# Patient Record
Sex: Female | Born: 1990 | Race: Black or African American | Hispanic: No | Marital: Single | Smoking: Never smoker
Health system: Southern US, Community
[De-identification: ages and names within clinical notes are randomized; demographics above are authoritative.]

---

## 2014-10-05 ENCOUNTER — Inpatient Hospital Stay (HOSPITAL_COMMUNITY)
Admission: EM | Admit: 2014-10-05 | Discharge: 2014-10-08 | DRG: 872 | Disposition: A | Discharge status: Home / Self Care | Payer: No Typology Code available for payment source | Attending: Family Medicine | Admitting: Family Medicine

## 2014-10-05 ENCOUNTER — Emergency Department (HOSPITAL_COMMUNITY): Payer: No Typology Code available for payment source

## 2014-10-05 ENCOUNTER — Encounter (HOSPITAL_COMMUNITY): Payer: Self-pay | Admitting: *Deleted

## 2014-10-05 DIAGNOSIS — L732 Hidradenitis suppurativa: Secondary | ICD-10-CM | POA: Diagnosis present

## 2014-10-05 DIAGNOSIS — E872 Acidosis: Secondary | ICD-10-CM | POA: Diagnosis present

## 2014-10-05 DIAGNOSIS — R112 Nausea with vomiting, unspecified: Secondary | ICD-10-CM | POA: Diagnosis present

## 2014-10-05 DIAGNOSIS — E860 Dehydration: Secondary | ICD-10-CM | POA: Diagnosis present

## 2014-10-05 DIAGNOSIS — N12 Tubulo-interstitial nephritis, not specified as acute or chronic: Secondary | ICD-10-CM | POA: Diagnosis present

## 2014-10-05 DIAGNOSIS — R6520 Severe sepsis without septic shock: Secondary | ICD-10-CM | POA: Diagnosis present

## 2014-10-05 DIAGNOSIS — E871 Hypo-osmolality and hyponatremia: Secondary | ICD-10-CM | POA: Diagnosis present

## 2014-10-05 DIAGNOSIS — E876 Hypokalemia: Secondary | ICD-10-CM | POA: Diagnosis present

## 2014-10-05 DIAGNOSIS — A419 Sepsis, unspecified organism: Principal | ICD-10-CM | POA: Diagnosis present

## 2014-10-05 DIAGNOSIS — R197 Diarrhea, unspecified: Secondary | ICD-10-CM | POA: Diagnosis present

## 2014-10-05 DIAGNOSIS — K529 Noninfective gastroenteritis and colitis, unspecified: Secondary | ICD-10-CM

## 2014-10-05 DIAGNOSIS — N390 Urinary tract infection, site not specified: Secondary | ICD-10-CM | POA: Diagnosis present

## 2014-10-05 LAB — URINE MICROSCOPIC-ADD ON

## 2014-10-05 LAB — URINALYSIS, ROUTINE W REFLEX MICROSCOPIC
Glucose, UA: NEGATIVE mg/dL
Ketones, ur: >80 mg/dL — AB
Nitrite: POSITIVE — AB
PH: 6 (ref 5.0–8.0)
Protein, ur: >300 mg/dL — AB
Specific Gravity, Urine: 1.029 (ref 1.005–1.030)
Urobilinogen, UA: 0.2 mg/dL (ref 0.0–1.0)

## 2014-10-05 LAB — CBG MONITORING, ED: GLUCOSE-CAPILLARY: 141 mg/dL — AB (ref 70–99)

## 2014-10-05 LAB — CBC WITH DIFFERENTIAL/PLATELET
Basophils Absolute: 0 10*3/uL (ref 0.0–0.1)
Basophils Relative: 0 % (ref 0–1)
Eosinophils Absolute: 0 10*3/uL (ref 0.0–0.7)
Eosinophils Relative: 0 % (ref 0–5)
HCT: 32.3 % — ABNORMAL LOW (ref 36.0–46.0)
Hemoglobin: 11.5 g/dL — ABNORMAL LOW (ref 12.0–15.0)
LYMPHS ABS: 0.8 10*3/uL (ref 0.7–4.0)
Lymphocytes Relative: 3 % — ABNORMAL LOW (ref 12–46)
MCH: 28.4 pg (ref 26.0–34.0)
MCHC: 35.6 g/dL (ref 30.0–36.0)
MCV: 79.8 fL (ref 78.0–100.0)
MONO ABS: 0.5 10*3/uL (ref 0.1–1.0)
Monocytes Relative: 2 % — ABNORMAL LOW (ref 3–12)
NEUTROS ABS: 25.6 10*3/uL — AB (ref 1.7–7.7)
Neutrophils Relative %: 95 % — ABNORMAL HIGH (ref 43–77)
PLATELETS: 269 10*3/uL (ref 150–400)
RBC: 4.05 MIL/uL (ref 3.87–5.11)
RDW: 13.7 % (ref 11.5–15.5)
WBC: 26.9 10*3/uL — ABNORMAL HIGH (ref 4.0–10.5)

## 2014-10-05 LAB — PREGNANCY, URINE: PREG TEST UR: NEGATIVE

## 2014-10-05 LAB — BASIC METABOLIC PANEL
Anion gap: 26 — ABNORMAL HIGH (ref 5–15)
BUN: 11 mg/dL (ref 6–23)
CALCIUM: 8.2 mg/dL — AB (ref 8.4–10.5)
CO2: 16 mEq/L — ABNORMAL LOW (ref 19–32)
CREATININE: 0.79 mg/dL (ref 0.50–1.10)
Chloride: 83 mEq/L — ABNORMAL LOW (ref 96–112)
GFR calc Af Amer: >90 mL/min (ref >90)
GLUCOSE: 130 mg/dL — AB (ref 70–99)
Potassium: 2.5 mEq/L — CL (ref 3.7–5.3)
Sodium: 125 mEq/L — ABNORMAL LOW (ref 137–147)

## 2014-10-05 LAB — PROTIME-INR
INR: 1.46 (ref 0.00–1.49)
Prothrombin Time: 17.9 seconds — ABNORMAL HIGH (ref 11.6–15.2)

## 2014-10-05 MED ORDER — SODIUM CHLORIDE 0.9 % IV BOLUS (SEPSIS)
1000.0000 mL | Freq: Once | INTRAVENOUS | Status: AC
  Administered 2014-10-05: 1000 mL via INTRAVENOUS

## 2014-10-05 MED ORDER — ACETAMINOPHEN 650 MG RE SUPP: 650.0000 mg | Freq: Four times a day (QID) | RECTAL | Status: DC | PRN

## 2014-10-05 MED ORDER — ONDANSETRON HCL 4 MG PO TABS
4.0000 mg | ORAL_TABLET | Freq: Four times a day (QID) | ORAL | Status: DC | PRN
  Administered 2014-10-05: 4 mg via ORAL
  Filled 2014-10-05: qty 1

## 2014-10-05 MED ORDER — POTASSIUM CHLORIDE 10 MEQ/100ML IV SOLN
10.0000 meq | INTRAVENOUS | Status: AC
  Administered 2014-10-05 (×3): 10 meq via INTRAVENOUS
  Filled 2014-10-05 (×3): qty 100

## 2014-10-05 MED ORDER — ONDANSETRON HCL 4 MG/2ML IJ SOLN
4.0000 mg | Freq: Once | INTRAMUSCULAR | Status: AC
  Administered 2014-10-05: 4 mg via INTRAVENOUS
  Filled 2014-10-05: qty 2

## 2014-10-05 MED ORDER — DEXTROSE 5 % IV SOLN
1.0000 g | INTRAVENOUS | Status: DC
  Administered 2014-10-05 – 2014-10-07 (×3): 1 g via INTRAVENOUS
  Filled 2014-10-05 (×4): qty 10

## 2014-10-05 MED ORDER — ACETAMINOPHEN 325 MG PO TABS: 650.0000 mg | ORAL_TABLET | Freq: Four times a day (QID) | ORAL | Status: DC | PRN

## 2014-10-05 MED ORDER — IOHEXOL 300 MG/ML  SOLN: 25.0000 mL | Freq: Once | INTRAMUSCULAR | Status: DC | PRN

## 2014-10-05 MED ORDER — SODIUM CHLORIDE 0.9 % IV SOLN
INTRAVENOUS | Status: DC
  Administered 2014-10-05 – 2014-10-07 (×3): via INTRAVENOUS
  Filled 2014-10-05 (×6): qty 1000

## 2014-10-05 MED ORDER — HYDROCODONE-ACETAMINOPHEN 5-325 MG PO TABS: 1.0000 | ORAL_TABLET | ORAL | Status: DC | PRN

## 2014-10-05 MED ORDER — HEPARIN SODIUM (PORCINE) 5000 UNIT/ML IJ SOLN
5000.0000 [IU] | Freq: Three times a day (TID) | INTRAMUSCULAR | Status: DC
  Administered 2014-10-05 – 2014-10-08 (×8): 5000 [IU] via SUBCUTANEOUS
  Filled 2014-10-05 (×10): qty 1

## 2014-10-05 MED ORDER — MUPIROCIN CALCIUM 2 % EX CREA
TOPICAL_CREAM | Freq: Two times a day (BID) | CUTANEOUS | Status: DC
  Administered 2014-10-05: 15:00:00 via TOPICAL
  Filled 2014-10-05: qty 15

## 2014-10-05 MED ORDER — ONDANSETRON HCL 4 MG/2ML IJ SOLN
4.0000 mg | Freq: Four times a day (QID) | INTRAMUSCULAR | Status: DC | PRN
  Administered 2014-10-07: 4 mg via INTRAVENOUS
  Filled 2014-10-05: qty 2

## 2014-10-05 MED ORDER — MORPHINE SULFATE 2 MG/ML IJ SOLN
1.0000 mg | INTRAMUSCULAR | Status: DC | PRN
Start: 2014-10-05 — End: 2014-10-06

## 2014-10-05 MED ORDER — IOHEXOL 300 MG/ML  SOLN
100.0000 mL | Freq: Once | INTRAMUSCULAR | Status: AC | PRN
  Administered 2014-10-05: 100 mL via INTRAVENOUS

## 2014-10-05 MED ORDER — ACETAMINOPHEN 500 MG PO TABS
1000.0000 mg | ORAL_TABLET | Freq: Once | ORAL | Status: AC
  Administered 2014-10-05: 1000 mg via ORAL

## 2014-10-05 MED ORDER — DIPHENHYDRAMINE HCL 50 MG/ML IJ SOLN
25.0000 mg | Freq: Once | INTRAMUSCULAR | Status: AC
  Administered 2014-10-05: 25 mg via INTRAVENOUS
  Filled 2014-10-05: qty 1

## 2014-10-05 MED ORDER — POTASSIUM CHLORIDE CRYS ER 20 MEQ PO TBCR
60.0000 meq | EXTENDED_RELEASE_TABLET | Freq: Once | ORAL | Status: AC
  Administered 2014-10-05: 60 meq via ORAL
  Filled 2014-10-05: qty 3

## 2014-10-05 NOTE — ED Provider Notes (Signed)
CSN: [id number]     Arrival date & time 10/05/14  0940 History   First MD Initiated Contact with Patient 10/05/14 [phone]     Chief Complaint  Patient presents with  . Emesis  . Diarrhea     (Consider location/radiation/quality/duration/timing/severity/associated sxs/prior Treatment) HPI Comments: Pt with 3 day hx of n/v/d.  Not able to keep anything down.  Has watery, nonbloody diarrhy.  Non-bloody, non-bilious vomiting.  No abd pain, just sore all over.  No fevers.  No recent travel, no recent abx.  Not taking anything at home.    Patient is a 23 y.o. female presenting with vomiting and diarrhea.  Emesis Associated symptoms: diarrhea and myalgias   Associated symptoms: no abdominal pain, no arthralgias, no chills and no headaches   Diarrhea Associated symptoms: myalgias and vomiting   Associated symptoms: no abdominal pain, no arthralgias, no chills, no diaphoresis, no fever and no headaches     History reviewed. No pertinent past medical history. History reviewed. No pertinent past surgical history. No family history on file. History  Substance Use Topics  . Smoking status: Never Smoker   . Smokeless tobacco: Not on file  . Alcohol Use: No   OB History    No data available     Review of Systems  Constitutional: Negative for fever, chills, diaphoresis and fatigue.  HENT: Negative for congestion, rhinorrhea and sneezing.   Eyes: Negative.   Respiratory: Negative for cough, chest tightness and shortness of breath.   Cardiovascular: Negative for chest pain and leg swelling.  Gastrointestinal: Positive for nausea, vomiting and diarrhea. Negative for abdominal pain and blood in stool.  Genitourinary: Negative for frequency, hematuria, flank pain and difficulty urinating.  Musculoskeletal: Positive for myalgias. Negative for back pain and arthralgias.  Skin: Negative for rash.  Neurological: Negative for dizziness, speech difficulty, weakness, numbness and headaches.       Allergies  Orange fruit  Home Medications   Prior to Admission medications   Not on File   BP 124/60 mmHg  Pulse 115  Temp(Src) 99.6 F (37.6 C) (Oral)  Resp 20  Ht 5\' 2"  (1.575 m)  Wt 180 lb (81.647 kg)  BMI 32.91 kg/m2  SpO2 100%  LMP 09/25/2014 Physical Exam  Constitutional: She is oriented to person, place, and time. She appears well-developed and well-nourished.  HENT:  Head: Normocephalic and atraumatic.  Eyes: Pupils are equal, round, and reactive to light.  Neck: Normal range of motion. Neck supple.  Cardiovascular: Regular rhythm and normal heart sounds.  Tachycardia present.   Pulmonary/Chest: Effort normal and breath sounds normal. No respiratory distress. She has no wheezes. She has no rales. She exhibits no tenderness.  Abdominal: Soft. Bowel sounds are normal. There is no tenderness. There is no rebound and no guarding.  Musculoskeletal: Normal range of motion. She exhibits no edema.  Lymphadenopathy:    She has no cervical adenopathy.  Neurological: She is alert and oriented to person, place, and time.  Skin: Skin is warm and dry. No rash noted.  Psychiatric: She has a normal mood and affect.    ED Course  Procedures (including critical care time) Labs Review Results for orders placed or performed during the hospital encounter of 10/05/14  Basic metabolic panel  Result Value Ref Range   Sodium 125 (L) 137 - 147 mEq/L   Potassium 2.5 (LL) 3.7 - 5.3 mEq/L   Chloride 83 (L) 96 - 112 mEq/L   CO2 16 (L) 19 - 32 mEq/L  Glucose, Bld 130 (H) 70 - 99 mg/dL   BUN 11 6 - 23 mg/dL   Creatinine, Ser 4.090.79 0.50 - 1.10 mg/dL   Calcium 8.2 (L) 8.4 - 10.5 mg/dL   GFR calc non Af Amer >90 >90 mL/min   GFR calc Af Amer >90 >90 mL/min   Anion gap 26 (H) 5 - 15  CBC with Differential  Result Value Ref Range   WBC 26.9 (H) 4.0 - 10.5 K/uL   RBC 4.05 3.87 - 5.11 MIL/uL   Hemoglobin 11.5 (L) 12.0 - 15.0 g/dL   HCT 81.132.3 (L) 91.436.0 - 78.246.0 %   MCV 79.8 78.0 -  100.0 fL   MCH 28.4 26.0 - 34.0 pg   MCHC 35.6 30.0 - 36.0 g/dL   RDW 95.613.7 21.311.5 - 08.615.5 %   Platelets 269 150 - 400 K/uL   Neutrophils Relative % 95 (H) 43 - 77 %   Lymphocytes Relative 3 (L) 12 - 46 %   Monocytes Relative 2 (L) 3 - 12 %   Eosinophils Relative 0 0 - 5 %   Basophils Relative 0 0 - 1 %   Neutro Abs 25.6 (H) 1.7 - 7.7 K/uL   Lymphs Abs 0.8 0.7 - 4.0 K/uL   Monocytes Absolute 0.5 0.1 - 1.0 K/uL   Eosinophils Absolute 0.0 0.0 - 0.7 K/uL   Basophils Absolute 0.0 0.0 - 0.1 K/uL   WBC Morphology VACUOLATED NEUTROPHILS   Urinalysis, Routine w reflex microscopic  Result Value Ref Range   Color, Urine ORANGE (A) YELLOW   APPearance CLOUDY (A) CLEAR   Specific Gravity, Urine 1.029 1.005 - 1.030   pH 6.0 5.0 - 8.0   Glucose, UA NEGATIVE NEGATIVE mg/dL   Hgb urine dipstick LARGE (A) NEGATIVE   Bilirubin Urine SMALL (A) NEGATIVE   Ketones, ur >80 (A) NEGATIVE mg/dL   Protein, ur >578>300 (A) NEGATIVE mg/dL   Urobilinogen, UA 0.2 0.0 - 1.0 mg/dL   Nitrite POSITIVE (A) NEGATIVE   Leukocytes, UA MODERATE (A) NEGATIVE  Urine microscopic-add on  Result Value Ref Range   Squamous Epithelial / LPF FEW (A) RARE   WBC, UA 7-10 <3 WBC/hpf   RBC / HPF 3-6 <3 RBC/hpf   Bacteria, UA FEW (A) RARE   Casts GRANULAR CAST (A) NEGATIVE  Pregnancy, urine  Result Value Ref Range   Preg Test, Ur NEGATIVE NEGATIVE  CBG monitoring, ED  Result Value Ref Range   Glucose-Capillary 141 (H) 70 - 99 mg/dL   Comment 1 Documented in Chart    Comment 2 Notify RN    Ct Abdomen Pelvis W Contrast  10/05/2014   CLINICAL DATA:  Nausea, vomiting, and diarrhea for 3 days.  EXAM: CT ABDOMEN AND PELVIS WITH CONTRAST  TECHNIQUE: Multidetector CT imaging of the abdomen and pelvis was performed using the standard protocol following bolus administration of intravenous contrast.  CONTRAST:  100mL OMNIPAQUE IOHEXOL 300 MG/ML  SOLN  COMPARISON:  None.  FINDINGS: The liver, biliary tree, spleen, pancreas, adrenal glands,  and kidneys are normal. The bowel is normal including the terminal ileum and appendix. Uterus and ovaries and bladder are normal. No free air or free fluid. No osseous abnormality.  IMPRESSION: Normal exam.   Electronically Signed   By: Geanie CooleyJim  Maxwell M.D.   On: 10/05/2014 16:37      Imaging Review Ct Abdomen Pelvis W Contrast  10/05/2014   CLINICAL DATA:  Nausea, vomiting, and diarrhea for 3 days.  EXAM: CT ABDOMEN  AND PELVIS WITH CONTRAST  TECHNIQUE: Multidetector CT imaging of the abdomen and pelvis was performed using the standard protocol following bolus administration of intravenous contrast.  CONTRAST:  OMNIPAQUE IOHEXOL 300 MG/ML  SOLN  COMPARISON:  None.  FINDINGS: The liver, biliary tree, spleen, pancreas, adrenal glands, and kidneys are normal. The bowel is normal including the terminal ileum and appendix. Uterus and ovaries and bladder are normal. No free air or free fluid. No osseous abnormality.  IMPRESSION: Normal exam.   Electronically Signed   By: Geanie Cooley M.D.   On: 10/05/2014 16:37     EKG Interpretation None      MDM   Final diagnoses:  Pyelonephritis  Gastroenteritis  Hyponatremia  Hypokalemia    Pt given 3 liters of IVF, HR improving, but still tachycardic.  Potassium and sodium are markedly low. Patient is given IV potassium replacement. She was given IV Rocephin for probable pyelonephritis. She did develop in the ED course and urticarial type rash and was given dose of Benadryl. She seemed to have started to develop the rash prior to the Rocephin, so I can't say that it was related to the Rocephin. The nurse also noted some small sores in her upper thighs. It seems to be some small razor burn type scab sores. Some sores are open and she has a small amount of drainage. But there is no underlying induration or abscess. There is no signs of cellulitis.  CT unremarkable, will admit to unassigned.  Rolan Bucco, MD 10/05/14 [phone]

## 2014-10-05 NOTE — ED Notes (Signed)
Pt assisted to bs commode.  Continued tachycardia.  Very weak.  Dr Fredderick PhenixBelfi notified.

## 2014-10-05 NOTE — ED Notes (Signed)
Dr Fredderick PhenixBelfi notified we needed a pregnancy test before CT abdomen and pelvis could be performed.

## 2014-10-05 NOTE — Plan of Care (Signed)
Problem: Consults Goal: Skin Care Protocol Initiated - if Braden Score 18 or less If consults are not indicated, leave blank or document N/A Outcome: Completed/Met Date Met:  10/05/14 Goal: Diabetes Guidelines if Diabetic/Glucose > 140 If diabetic or lab glucose is > 140 mg/dl - Initiate Diabetes/Hyperglycemia Guidelines & Document Interventions  Outcome: Not Applicable Date Met:  49/82/64  Problem: Phase I Progression Outcomes Goal: Pain controlled with appropriate interventions Outcome: Completed/Met Date Met:  10/05/14 Goal: Voiding-avoid urinary catheter unless indicated Outcome: Completed/Met Date Met:  10/05/14

## 2014-10-05 NOTE — ED Notes (Signed)
Admitting MD at bedside.

## 2014-10-05 NOTE — H&P (Signed)
[facility] History and Physical  Michele Ricelexis Lizer WJX:[id number] DOB: [birth year] DOA: 10/05/2014  Referring physician: EDP PCP: No primary care provider on file.   Chief Complaint: Nauseam, vomiting and diarrhea  HPI: Michele Mckinney is a 23 y.o. female with no significant past medical history presented to the ED with nausea, vomiting and diarrhea. [place], she is visiting family in [place], patient started to have chills, feeling hot and cold and sweaty in the same time since Monday morning, all she has nausea, vomiting and diarrhea. Patient also mention some abdominal pain she thinks came after the vomiting. In the ED patient was found to have temp of 101.1, heart rate of 130, urinalysis consistent with UTI and she does have potassium of 2.5 and sodium of 125. Patient admitted to the hospital for further evaluation.  Review of Systems:  Constitutional: negative for anorexia, fevers and sweats Eyes: negative for irritation, redness and visual disturbance Ears, nose, mouth, throat, and face: negative for earaches, epistaxis, nasal congestion and sore throat  Respiratory: negative for cough, dyspnea on exertion, sputum and wheezing Cardiovascular: negative for chest pain, dyspnea, lower extremity edema, orthopnea, palpitations and syncope Gastrointestinal: Nausea, vomiting, diarrhea and abdominal pain. Genitourinary:negative for dysuria, frequency and hematuria Hematologic/lymphatic: negative for bleeding, easy bruising and lymphadenopathy Musculoskeletal:negative for arthralgias, muscle weakness and stiff joints Neurological: negative for coordination problems, gait problems, headaches and weakness Endocrine: negative for diabetic symptoms including polydipsia, polyuria and weight loss Allergic/Immunologic: negative for anaphylaxis, hay fever and urticaria  History reviewed. No pertinent past medical history. History reviewed. No pertinent past surgical  history. Social History:   reports that she has never smoked. She does not have any smokeless tobacco history on file. She reports that she does not drink alcohol or use illicit drugs.  Allergies  Allergen Reactions  . Orange Fruit [Citrus]     No family history on file.   Prior to Admission medications   Not on File   Physical Exam: Filed Vitals:   10/05/14 1730  BP: 111/58  Pulse: 114  Temp:   Resp: 16   Constitutional: Oriented to person, place, and time. Well-developed and well-nourished. Cooperative.  Head: Normocephalic and atraumatic.  Nose: Nose normal.  Mouth/Throat: Uvula is midline, oropharynx is clear and moist and mucous membranes are normal.  Eyes: Conjunctivae and EOM are normal. Pupils are equal, round, and reactive to light.  Neck: Trachea normal and normal range of motion. Neck supple.  Cardiovascular: Normal rate, regular rhythm, S1 normal, S2 normal, normal heart sounds and intact distal pulses.   Pulmonary/Chest: Effort normal and breath sounds normal.  Abdominal: Soft. Bowel sounds are normal. There is no hepatosplenomegaly. There is no tenderness.  Musculoskeletal: Normal range of motion.  Neurological: Alert and oriented to person, place, and time. Has normal strength. No cranial nerve deficit or sensory deficit.  Skin: Skin is warm, dry and intact.  Psychiatric: Has a normal mood and affect. Speech is normal and behavior is normal.   Labs on Admission:  Basic Metabolic Panel:  Recent Labs Lab 10/05/14 0959  NA 125*  K 2.5*  CL 83*  CO2 16*  GLUCOSE 130*  BUN 11  CREATININE 0.79  CALCIUM 8.2*   Liver Function Tests: No results for input(s): AST, ALT, ALKPHOS, BILITOT, PROT, ALBUMIN in the last 168 hours. No results for input(s): LIPASE, AMYLASE in the last 168 hours. No results for input(s): AMMONIA in the last 168 hours. CBC:  Recent Labs Lab 10/05/14 [phone]  WBC 26.9*  NEUTROABS 25.6*  HGB 11.5*  HCT 32.3*  MCV 79.8  PLT 269    Cardiac Enzymes: No results for input(s): CKTOTAL, CKMB, CKMBINDEX, TROPONINI in the last 168 hours.  BNP (last 3 results) No results for input(s): PROBNP in the last 8760 hours. CBG:  Recent Labs Lab 10/05/14 0955  GLUCAP 141*    Radiological Exams on Admission: Ct Abdomen Pelvis W Contrast  10/05/2014   CLINICAL DATA:  Nausea, vomiting, and diarrhea for 3 days.  EXAM: CT ABDOMEN AND PELVIS WITH CONTRAST  TECHNIQUE: Multidetector CT imaging of the abdomen and pelvis was performed using the standard protocol following bolus administration of intravenous contrast.  CONTRAST:  100mL OMNIPAQUE IOHEXOL 300 MG/ML  SOLN  COMPARISON:  None.  FINDINGS: The liver, biliary tree, spleen, pancreas, adrenal glands, and kidneys are normal. The bowel is normal including the terminal ileum and appendix. Uterus and ovaries and bladder are normal. No free air or free fluid. No osseous abnormality.  IMPRESSION: Normal exam.   Electronically Signed   By: Geanie CooleyJim  Maxwell M.D.   On: 10/05/2014 16:37    EKG: Independently reviewed.   Assessment/Plan Principal Problem:   Sepsis Active Problems:   UTI (lower urinary tract infection)   Nausea and vomiting   Diarrhea   Hypokalemia   Hyponatremia    Sepsis Presented with heart rate of 130, temperature of 101.1, WBC of 27K and presence of UTI. Urine and blood cultures obtained. Patient is started on Rocephin in the ED, we'll continue. Hydrate aggressively with IV fluids.  UTI Likely the cause of the sepsis, as mentioned above urinalysis consistent with UTI. Started on Rocephin, will adjust antibiotics according to the culture results.  Hyponatremia Presented with a sodium of 125, likely secondary to dehydration from vomiting and sepsis. Hydrate aggressively with IV fluids, start normal saline at 100 mL per hour.  Hypokalemia Potassium of 2.5, likely secondary to vomiting. Replete with oral and IV supplements.  Nausea, vomiting and  diarrhea This is likely secondary to systemic effect of UTI and sepsis. Cannot rule out gastroenteritis, treat symptomatically with antiemetics and IV fluid hydration.  Code Status: Full code Family Communication: Plan discussed with patient Disposition Plan: MedSurg, inpatient  Time spent: 70 minutes  [facility],Damante Spragg A [facility] Pager [phone]

## 2014-10-05 NOTE — ED Notes (Signed)
CT notified pt is finished with contrast. 

## 2014-10-05 NOTE — ED Notes (Signed)
Pt c/o emesis and diarrhea x 3 days.  Denies abdominal pain.  Tachy at 140 with rr of 26.

## 2014-10-05 NOTE — ED Notes (Signed)
Dr Fredderick PhenixBelfi notified of critical potassium.

## 2014-10-06 LAB — CBC
HCT: 25.7 % — ABNORMAL LOW (ref 36.0–46.0)
Hemoglobin: 8.9 g/dL — ABNORMAL LOW (ref 12.0–15.0)
MCH: 27.5 pg (ref 26.0–34.0)
MCHC: 34.6 g/dL (ref 30.0–36.0)
MCV: 79.3 fL (ref 78.0–100.0)
PLATELETS: 259 10*3/uL (ref 150–400)
RBC: 3.24 MIL/uL — AB (ref 3.87–5.11)
RDW: 13.9 % (ref 11.5–15.5)
WBC: 19.8 10*3/uL — ABNORMAL HIGH (ref 4.0–10.5)

## 2014-10-06 LAB — BASIC METABOLIC PANEL
Anion gap: 17 — ABNORMAL HIGH (ref 5–15)
BUN: 6 mg/dL (ref 6–23)
CALCIUM: 7 mg/dL — AB (ref 8.4–10.5)
CO2: 20 mEq/L (ref 19–32)
Chloride: 98 mEq/L (ref 96–112)
Creatinine, Ser: 0.7 mg/dL (ref 0.50–1.10)
Glucose, Bld: 95 mg/dL (ref 70–99)
Potassium: 2.7 mEq/L — CL (ref 3.7–5.3)
Sodium: 135 mEq/L — ABNORMAL LOW (ref 137–147)

## 2014-10-06 LAB — GI PATHOGEN PANEL BY PCR, STOOL
C DIFFICILE TOXIN A/B: NEGATIVE
CAMPYLOBACTER BY PCR: NEGATIVE
Cryptosporidium by PCR: NEGATIVE
E coli (ETEC) LT/ST: NEGATIVE
E coli (STEC): NEGATIVE
E coli 0157 by PCR: NEGATIVE
G LAMBLIA BY PCR: NEGATIVE
Norovirus GI/GII: NEGATIVE
Rotavirus A by PCR: NEGATIVE
Salmonella by PCR: NEGATIVE
Shigella by PCR: NEGATIVE

## 2014-10-06 LAB — MAGNESIUM: MAGNESIUM: 1.4 mg/dL — AB (ref 1.5–2.5)

## 2014-10-06 MED ORDER — POTASSIUM CHLORIDE CRYS ER 20 MEQ PO TBCR
60.0000 meq | EXTENDED_RELEASE_TABLET | Freq: Two times a day (BID) | ORAL | Status: DC
  Administered 2014-10-06 – 2014-10-08 (×5): 60 meq via ORAL
  Filled 2014-10-06 (×6): qty 3

## 2014-10-06 MED ORDER — POTASSIUM CHLORIDE 10 MEQ/100ML IV SOLN
10.0000 meq | INTRAVENOUS | Status: AC
  Administered 2014-10-06 (×4): 10 meq via INTRAVENOUS
  Filled 2014-10-06 (×4): qty 100

## 2014-10-06 NOTE — Progress Notes (Signed)
Patient informed RN of a boil or abscess that had "burst" in her right axilla. Small pinpoint hole noted with scant amount purulent drainage oozing from site. Bandaid applied. Dr. Mahala MenghiniSamtani notified.   Leanna BattlesEckelmann, Evelynne Spiers Eileen, RN.

## 2014-10-06 NOTE — Progress Notes (Signed)
Michele Mckinney Michele Mckinney Mckinney [id number] DOB: February 22, 1991 DOA: 10/05/2014 PCP: No primary care provider on file.  Brief narrative: 23 y/o ? admit 10/05/14 with franks severe sepsis-cause possibe Pyelonephritis-Tmax 130, UA=Pyelo, Volume depletion Metabolic acidosis and vomiting.  Coious watery diarrhea from 3 days priro.  No cp or sob.  No cough  Past medical history-As per Problem list Chart reviewed as below-   Consultants:    Procedures:    Antibiotics:  Rocephin 12/10   Subjective  Better but still ill + anorexia No CP nor SOB nor Sputum   Objective    Interim History:   Telemetry: Sinus tach   Objective: Filed Vitals:   10/05/14 1854 10/05/14 2253 10/06/14 0557 10/06/14 0931  BP: 105/53 113/45 105/42 117/52  Pulse: 116 128 104 107  Temp: 99.6 F (37.6 C) 100.6 F (38.1 C) 99 F (37.2 C) 98.6 F (37 C)  TempSrc: Oral Oral Oral Oral  Resp: 21 20 20 20   Height:      Weight:   86.5 kg (190 lb 11.2 oz)   SpO2: 100% 100% 100% 100%    Intake/Output Summary (Last 24 hours) at 10/06/14 1118 Last data filed at 10/06/14 0932  Gross per 24 hour  Intake   2470 ml  Output    400 ml  Net   2070 ml    Exam:  General: eomi, ncat Cardiovascular:  s1 s 2no m/r/g Respiratory: clear no added sound Abdomen:  Soft NT/ND no rebound nor gauduing Skin no lower extremity or upper extremity edema although subjectively feels swollen Neuro grossly intact  Data Reviewed: Basic Metabolic Panel:  Recent Labs Lab 10/05/14 0959 10/06/14 0523  NA 125* 135*  K 2.5* 2.7*  CL 83* 98  CO2 16* 20  GLUCOSE 130* 95  BUN 11 6  CREATININE 0.79 0.70  CALCIUM 8.2* 7.0*  MG  --  1.4*   Liver Function Tests: No results for input(s): AST, ALT, ALKPHOS, BILITOT, PROT, ALBUMIN in the last 168 hours. No results for input(s): LIPASE, AMYLASE in the last 168 hours. No results for input(s): AMMONIA in the last 168 hours. CBC:  Recent Labs Lab 10/05/14 0959 10/06/14 0523  WBC  26.9* 19.8*  NEUTROABS 25.6*  --   HGB 11.5* 8.9*  HCT 32.3* 25.7*  MCV 79.8 79.3  PLT 269 259   Cardiac Enzymes: No results for input(s): CKTOTAL, CKMB, CKMBINDEX, TROPONINI in the last 168 hours. BNP: Invalid input(s): POCBNP CBG:  Recent Labs Lab 10/05/14 0955  GLUCAP 141*    No results found for this or any previous visit (from the past 240 hour(s)).   Studies:              All Imaging reviewed and is as per above notation   Scheduled Meds: . cefTRIAXone (ROCEPHIN)  IV  1 g Intravenous Q24H  . heparin  5,000 Units Subcutaneous 3 times per day  . potassium chloride  10 mEq Intravenous Q1 Hr x 4   Continuous Infusions: . sodium chloride 0.9 % 1,000 mL with potassium chloride 20 mEq infusion 100 mL/hr at 10/06/14 1048     Assessment/Plan: 1. Severe Sepsis 2/2 Likely viral diathesis. Unlikely back. Given nausea vomiting and diarrhea as well as myalgias. We will test for flulike symptoms or for gastric pathogens if she does not seem to get better in the next 24 hours.  C. difficile PCR is pending.  Continue Rocephin 1 g q 24 for now  2. Metabolic acidosis probably secondary to loss  of bicarbonate from vomiting/diarrhea. Should resolve with IV fluid repletion as well as correction of underlying infectious process 3. Severe hypokalemia-replaced both with IV 3 months as well as by mouth potassium 60 twice a day. 4. Vaginal yeast-we'll prescribe fluconazole 200 once and then 100 mg daily for 5 days  Code Status: Full Family Communication: Friends and family at bedside Disposition Plan: Inpatient   Michele KochJai Michele Mckinney Mckinney Notarianni, MD  [facility] Pager [phone] 10/06/2014, 11:18 AM    LOS: 1 day

## 2014-10-07 DIAGNOSIS — A4102 Sepsis due to Methicillin resistant Staphylococcus aureus: Secondary | ICD-10-CM

## 2014-10-07 LAB — BASIC METABOLIC PANEL
ANION GAP: 14 (ref 5–15)
BUN: 5 mg/dL — ABNORMAL LOW (ref 6–23)
CALCIUM: 8.2 mg/dL — AB (ref 8.4–10.5)
CO2: 20 mEq/L (ref 19–32)
Chloride: 104 mEq/L (ref 96–112)
Creatinine, Ser: 0.49 mg/dL — ABNORMAL LOW (ref 0.50–1.10)
GFR calc Af Amer: >90 mL/min (ref >90)
Glucose, Bld: 110 mg/dL — ABNORMAL HIGH (ref 70–99)
Potassium: 3.5 mEq/L — ABNORMAL LOW (ref 3.7–5.3)
SODIUM: 138 meq/L (ref 137–147)

## 2014-10-07 LAB — URINE CULTURE: Colony Count: 50000

## 2014-10-07 LAB — CBC
HCT: 26.2 % — ABNORMAL LOW (ref 36.0–46.0)
Hemoglobin: 8.9 g/dL — ABNORMAL LOW (ref 12.0–15.0)
MCH: 26.3 pg (ref 26.0–34.0)
MCHC: 34 g/dL (ref 30.0–36.0)
MCV: 77.5 fL — AB (ref 78.0–100.0)
PLATELETS: 267 10*3/uL (ref 150–400)
RBC: 3.38 MIL/uL — ABNORMAL LOW (ref 3.87–5.11)
RDW: 14 % (ref 11.5–15.5)
WBC: 15.2 10*3/uL — AB (ref 4.0–10.5)

## 2014-10-07 MED ORDER — DOXYCYCLINE HYCLATE 100 MG PO TABS
100.0000 mg | ORAL_TABLET | Freq: Two times a day (BID) | ORAL | Status: DC
  Administered 2014-10-07 – 2014-10-08 (×3): 100 mg via ORAL
  Filled 2014-10-07 (×4): qty 1

## 2014-10-07 MED ORDER — FLUCONAZOLE 200 MG PO TABS
200.0000 mg | ORAL_TABLET | Freq: Once | ORAL | Status: AC
  Administered 2014-10-07: 200 mg via ORAL
  Filled 2014-10-07: qty 1

## 2014-10-07 MED ORDER — WHITE PETROLATUM GEL
Status: AC
  Administered 2014-10-07: 11:00:00
  Filled 2014-10-07: qty 5

## 2014-10-07 MED ORDER — FLUCONAZOLE 100 MG PO TABS
100.0000 mg | ORAL_TABLET | Freq: Every day | ORAL | Status: DC
  Administered 2014-10-08: 100 mg via ORAL
  Filled 2014-10-07: qty 1

## 2014-10-07 NOTE — Evaluation (Signed)
Physical Therapy Evaluation Patient Details Name: Michele Mckinney MRN: [id number] DOB: [birth year] Today's Date: 10/07/2014   History of Present Illness  Michele Ricelexis Bangert is a 23 y.o. female with no significant past medical history presented to the ED with nausea, vomiting and diarrhea. [place] is [place], [place], she is visiting family in [place], patient started to have chills, feeling hot and cold and sweaty in the same time since Monday morning, all she has nausea, vomiting and diarrhea. Patient also mention some abdominal pain she thinks came after the vomiting. Pt with sepsis with positive UTI.  Clinical Impression  Pt admitted with above diagnosis. Pt currently with functional limitations due to the deficits listed below (see PT Problem List). Pt required assistive device for safe ambulation today (RW) and fatigues very quickly. Instructed her to ambulate with nsg as well and discussed exercises for in room.  Pt will benefit from skilled PT to increase their independence and safety with mobility to allow discharge to the venue listed below.       Follow Up Recommendations Home health PT;Supervision - Intermittent    Equipment Recommendations  Other (comment) (TBD)    Recommendations for Other Services       Precautions / Restrictions Precautions Precautions: None Restrictions Weight Bearing Restrictions: No      Mobility  Bed Mobility Overal bed mobility: Independent                Transfers Overall transfer level: Needs assistance Equipment used: Rolling walker (2 wheeled) Transfers: Sit to/from Stand Sit to Stand: Supervision         General transfer comment: slow mvmts, vc's for hand position  Ambulation/Gait Ambulation/Gait assistance: Min assist;Supervision Ambulation Distance (Feet): 100 Feet (40', 60') Assistive device: 1 person hand held assist;Rolling walker (2 wheeled) Gait Pattern/deviations: Step-through pattern;Decreased stride length Gait  velocity: decreased Gait velocity interpretation: Below normal speed for age/gender General Gait Details: pt prefers to not use AD, first ambulation without AD, she required min HHA, was unsteady and very slow, reaching for rail with other hand. Second time, pt able to ambulate farther and faster with RW and supervision, though still with bilateral knees slightly flexed and increased UE pressure on RW. Pt with increased RR and 2/4 DOE. O2 sats 99%.  Stairs            Wheelchair Mobility    Modified Rankin (Stroke Patients Only)       Balance Overall balance assessment: Needs assistance Sitting-balance support: No upper extremity supported Sitting balance-Leahy Scale: Normal     Standing balance support: No upper extremity supported Standing balance-Leahy Scale: Fair                               Pertinent Vitals/Pain Pain Assessment: No/denies pain    Home Living Family/patient expects to be discharged to:: Private residence Living Arrangements: Parent Available Help at Discharge: Family;Available PRN/intermittently Type of Home: House Home Access: Level entry     Home Layout: Two level Home Equipment: None Additional Comments: pt lives with her father who is in a scooter, she reports that mother and sister can help as needed as well. Her bedroom is in the attic, steep narrow staircase. 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 [place] boyfriend [place] in [place]    Prior Function Level of Independence: Independent               Hand Dominance  Extremity/Trunk Assessment   Upper Extremity Assessment: Overall WFL for tasks assessed           Lower Extremity Assessment: RLE deficits/detail;LLE deficits/detail;Generalized weakness RLE Deficits / Details: swelling bilateral LE's L>R, hip flex 3+/5, knee flex/ ext 3+/5, pt's knees feel that they may buckle with housegold ambulation distance LLE Deficits / Details: swelling, hip flex 3+/5, knee flex/ ext  3+/5  Cervical / Trunk Assessment: Normal  Communication   Communication: No difficulties  Cognition Arousal/Alertness: Awake/alert Behavior During Therapy: WFL for tasks assessed/performed Overall Cognitive Status: Within Functional Limits for tasks assessed                      General Comments      Exercises General Exercises - Lower Extremity Mini-Sqauts: 10 reps      Assessment/Plan    PT Assessment Patient needs continued PT services  PT Diagnosis Difficulty walking;Abnormality of gait;Generalized weakness   PT Problem List Decreased strength;Decreased activity tolerance;Decreased balance;Decreased mobility;Decreased knowledge of use of DME;Decreased knowledge of precautions  PT Treatment Interventions DME instruction;Gait training;Stair training;Functional mobility training;Therapeutic activities;Therapeutic exercise;Balance training;Patient/family education   PT Goals (Current goals can be found in the Care Plan section) Acute Rehab PT Goals Patient Stated Goal: return to home and work PT Goal Formulation: With patient Time For Goal Achievement: 10/21/14 Potential to Achieve Goals: Good    Frequency Min 3X/week   Barriers to discharge Decreased caregiver support      Co-evaluation               End of Session Equipment Utilized During Treatment: Gait belt Activity Tolerance: Patient tolerated treatment well Patient left: in chair;with call bell/phone within reach Nurse Communication: Mobility status         Time: 1136-1203 PT Time Calculation (min) (ACUTE ONLY): 27 min   Charges:   PT Evaluation $Initial PT Evaluation Tier I: 1 Procedure PT Treatments $Gait Training: 23-37 mins   PT G Codes:         Lyanne CoVictoria Kian Ottaviano, PT  Acute Rehab Services  [phone]  Lyanne CoManess, Halen Antenucci 10/07/2014, 12:41 PM

## 2014-10-07 NOTE — Progress Notes (Signed)
Michele Mckinney VHQ:[id number] DOB: [birth year] DOA: 10/05/2014 PCP: No primary care provider on file.  Brief narrative:  23 y/o ? admit 10/05/14 with franks severe sepsis-cause possibe Pyelonephritis-Tmax 130, UA=Pyelo, Volume depletion Metabolic acidosis and vomiting.  Copious watery diarrhea from 3 days priro.  No cp or sob.  No cough. Noted that she had a hidradenitis abscess burst in her R axilla day of admission and started oozing.  She responded clinically  Past medical history-As per Problem list Chart reviewed as below-  Consultants:  Gen surgery  Antibiotics:  Rocephin 12/10   Subjective   Well.  Feels better. NO N/V/Sob No fever.  tol diet Pain in R axilla    Objective    Interim History:   Telemetry: Sinus tach   Objective: Filed Vitals:   10/06/14 1712 10/06/14 2142 10/07/14 0510 10/07/14 1004  BP: 115/65 101/58 106/56 124/78  Pulse: 100 94 89 86  Temp: 98.5 F (36.9 C) 98.8 F (37.1 C) 97.9 F (36.6 C) 98.1 F (36.7 C)  TempSrc: Oral Oral Oral Oral  Resp: 18 17 18 20   Height:      Weight:      SpO2: 100% 100% 100% 100%    Intake/Output Summary (Last 24 hours) at 10/07/14 1357 Last data filed at 10/07/14 0900  Gross per 24 hour  Intake 1547.5 ml  Output   1000 ml  Net  547.5 ml    Exam:  General: eomi, ncat Cardiovascular:  s1 s 2no m/r/g Respiratory: clear no added sound Abdomen:  Soft NT/ND no rebound nor gauduing Skin-draining area in axilla.  Yellow Pus oozing.  Nodular firm areas in axilla Neuro grossly intact  Data Reviewed: Basic Metabolic Panel:  Recent Labs Lab 10/05/14 0959 10/06/14 0523 10/07/14 0432  NA 125* 135* 138  K 2.5* 2.7* 3.5*  CL 83* 98 104  CO2 16* 20 20  GLUCOSE 130* 95 110*  BUN 11 6 5*  CREATININE 0.79 0.70 0.49*  CALCIUM 8.2* 7.0* 8.2*  MG  --  1.4*  --    Liver Function Tests: No results for input(s): AST, ALT, ALKPHOS, BILITOT, PROT, ALBUMIN in the last 168 hours. No results for  input(s): LIPASE, AMYLASE in the last 168 hours. No results for input(s): AMMONIA in the last 168 hours. CBC:  Recent Labs Lab 10/05/14 0959 10/06/14 0523 10/07/14 0432  WBC 26.9* 19.8* 15.2*  NEUTROABS 25.6*  --   --   HGB 11.5* 8.9* 8.9*  HCT 32.3* 25.7* 26.2*  MCV 79.8 79.3 77.5*  PLT 269 259 267   Cardiac Enzymes: No results for input(s): CKTOTAL, CKMB, CKMBINDEX, TROPONINI in the last 168 hours. BNP: Invalid input(s): POCBNP CBG:  Recent Labs Lab 10/05/14 0955  GLUCAP 141*    Recent Results (from the past 240 hour(s))  Urine culture     Status: None (Preliminary result)   Collection Time: 10/05/14 10:57 AM  Result Value Ref Range Status   Specimen Description URINE, CLEAN CATCH  Final   Special Requests NONE  Final   Culture  Setup Time   Final    10/06/2014 00:51 Performed at [facility]    Colony Count   Final    50,000 COLONIES/ML Performed at [facility]    Culture   Final    ESCHERICHIA COLI Performed at [facility]    Report Status PENDING  Incomplete  Culture, blood (routine x 2)     Status: None (Preliminary result)   Collection Time:  10/05/14  7:50 PM  Result Value Ref Range Status   Specimen Description BLOOD LEFT ARM  Final   Special Requests BOTTLES DRAWN AEROBIC AND ANAEROBIC 10CC  Final   Culture  Setup Time   Final    10/06/2014 00:39 Performed at [facility]    Culture   Final           BLOOD CULTURE RECEIVED NO GROWTH TO DATE CULTURE WILL BE HELD FOR 5 DAYS BEFORE ISSUING A FINAL NEGATIVE REPORT Performed at [facility]    Report Status PENDING  Incomplete  Culture, blood (routine x 2)     Status: None (Preliminary result)   Collection Time: 10/05/14  8:05 PM  Result Value Ref Range Status   Specimen Description BLOOD LEFT HAND  Final   Special Requests BOTTLES DRAWN AEROBIC ONLY 5CC  Final   Culture  Setup Time   Final    10/06/2014 00:39 Performed at [facility]     Culture   Final           BLOOD CULTURE RECEIVED NO GROWTH TO DATE CULTURE WILL BE HELD FOR 5 DAYS BEFORE ISSUING A FINAL NEGATIVE REPORT Performed at [facility]    Report Status PENDING  Incomplete     Studies:              All Imaging reviewed and is as per above notation   Scheduled Meds: . cefTRIAXone (ROCEPHIN)  IV  1 g Intravenous Q24H  . heparin  5,000 Units Subcutaneous 3 times per day  . potassium chloride  60 mEq Oral BID   Continuous Infusions: . sodium chloride 0.9 % 1,000 mL with potassium chloride 20 mEq infusion 50 mL/hr at 10/07/14 0558     Assessment/Plan: 1. Severe Sepsis 2/2 Infected hidradenitis.  Continue Rocephin 1 g q 24 for now-Gen surgery to see-will obtain wound culture.  Much appreciated-thank you 2. Metabolic acidosis probably secondary to loss of bicarbonate from vomiting/diarrhea. Should resolve with IV fluid repletion as well as correction of underlying infectious process 3. Severe hypokalemia-replaced both with IV 3 months as well as by mouth potassium 60 twice a day.  resolving 4. Vaginal yeast-we'll prescribe fluconazole 200 once and then 100 mg daily for 5 days  Code Status: Full Family Communication: patient alone Disposition Plan: Inpatient   Pleas KochJai Spike Desilets, MD  [facility] Pager [phone] 10/07/2014, 1:57 PM    LOS: 2 days

## 2014-10-07 NOTE — Consult Note (Signed)
Mild to moderate hidradenitis of the right axilla, draining and not causing sepsis.  She also has an E.coli UTI.   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  Her Right axillary abscess is draining and after probing with a long Q-tip, it does not seem to be loculated.  Culutres were sent.  Will start the patient on doxycycline and await culture results.  Unfortunately the patient is from [place] and cannot follow up with us in [place].  She should stay on antibiotic for at least 2 weeks.  Marta LamasJames O. Gae BonWyatt, III, MD, FACS [phone]--pager [phone]--office [facility]

## 2014-10-08 LAB — CBC WITH DIFFERENTIAL/PLATELET
BASOS ABS: 0 10*3/uL (ref 0.0–0.1)
BASOS PCT: 0 % (ref 0–1)
Band Neutrophils: 0 % (ref 0–10)
Blasts: 0 %
EOS ABS: 2 10*3/uL — AB (ref 0.0–0.7)
EOS PCT: 15 % — AB (ref 0–5)
HEMATOCRIT: 29.3 % — AB (ref 36.0–46.0)
HEMOGLOBIN: 9.9 g/dL — AB (ref 12.0–15.0)
LYMPHS ABS: 3.3 10*3/uL (ref 0.7–4.0)
LYMPHS PCT: 24 % (ref 12–46)
MCH: 27.4 pg (ref 26.0–34.0)
MCHC: 33.8 g/dL (ref 30.0–36.0)
MCV: 81.2 fL (ref 78.0–100.0)
METAMYELOCYTES PCT: 0 %
MONO ABS: 1.1 10*3/uL — AB (ref 0.1–1.0)
MONOS PCT: 8 % (ref 3–12)
Myelocytes: 0 %
Neutro Abs: 7.2 10*3/uL (ref 1.7–7.7)
Neutrophils Relative %: 53 % (ref 43–77)
Platelets: 315 10*3/uL (ref 150–400)
Promyelocytes Absolute: 0 %
RBC: 3.61 MIL/uL — ABNORMAL LOW (ref 3.87–5.11)
RDW: 14.6 % (ref 11.5–15.5)
WBC: 13.6 10*3/uL — AB (ref 4.0–10.5)
nRBC: 0 /100 WBC

## 2014-10-08 LAB — BASIC METABOLIC PANEL
ANION GAP: 13 (ref 5–15)
BUN: 7 mg/dL (ref 6–23)
CO2: 21 meq/L (ref 19–32)
Calcium: 8.8 mg/dL (ref 8.4–10.5)
Chloride: 103 mEq/L (ref 96–112)
Creatinine, Ser: 0.53 mg/dL (ref 0.50–1.10)
GFR calc Af Amer: >90 mL/min (ref >90)
GFR calc non Af Amer: >90 mL/min (ref >90)
Glucose, Bld: 82 mg/dL (ref 70–99)
Potassium: 4.3 mEq/L (ref 3.7–5.3)
SODIUM: 137 meq/L (ref 137–147)

## 2014-10-08 MED ORDER — "VIASORB DRESSING 4""X6"" PADS": 1.0000 | MEDICATED_PAD | Freq: Every day | Status: AC

## 2014-10-08 MED ORDER — DOXYCYCLINE HYCLATE 100 MG PO TABS
100.0000 mg | ORAL_TABLET | Freq: Two times a day (BID) | ORAL | Status: AC
Start: 2014-10-08 — End: ?

## 2014-10-08 MED ORDER — FLUCONAZOLE 100 MG PO TABS
100.0000 mg | ORAL_TABLET | Freq: Every day | ORAL | Status: AC
Start: 2014-10-08 — End: ?

## 2014-10-08 NOTE — Progress Notes (Signed)
Patient ID: Michele RiceAlexis Mckinney, female   DOB: February 12, 1991, 23 y.o.   MRN: [id number]    Subjective: Feeling generally much better. Having drainage from right axilla which also was feeling better.  Objective: Vital signs in last 24 hours: Temp:  [97.7 F (36.5 C)-98.6 F (37 C)] 97.7 F (36.5 C) (12/13 0405) Pulse Rate:  [72-106] 72 (12/13 0405) Resp:  [16-18] 16 (12/13 0405) BP: (108-118)/(54-84) 110/54 mmHg (12/13 0405) SpO2:  [100 %] 100 % (12/13 0405) Weight:  [188 lb 14.4 oz (85.684 kg)] 188 lb 14.4 oz (85.684 kg) (12/12 2008) Last BM Date: 10/07/14  Intake/Output from previous day: 12/12 0701 - 12/13 0700 In: 940 [P.O.:240; I.V.:700] Out: -  Intake/Output this shift:    General appearance: alert, cooperative and no distress Skin: 5 mm opening right axilla with slight purulent drainage and no surrounding induration or erythema  Lab Results:   Recent Labs  10/07/14 0432 10/08/14 0440  WBC 15.2* 13.6*  HGB 8.9* 9.9*  HCT 26.2* 29.3*  PLT 267 315   BMET  Recent Labs  10/07/14 0432 10/08/14 0440  NA 138 137  K 3.5* 4.3  CL 104 103  CO2 20 21  GLUCOSE 110* 82  BUN 5* 7  CREATININE 0.49* 0.53  CALCIUM 8.2* 8.8     Studies/Results: No results found.  Anti-infectives: Anti-infectives    Start     Dose/Rate Route Frequency Ordered Stop   10/08/14 1000  fluconazole (DIFLUCAN) tablet 100 mg     100 mg Oral Daily 10/07/14 1432     10/07/14 1545  doxycycline (VIBRA-TABS) tablet 100 mg     100 mg Oral Every 12 hours 10/07/14 1530     10/07/14 1500  fluconazole (DIFLUCAN) tablet 200 mg     200 mg Oral  Once 10/07/14 1432 10/07/14 1449   10/05/14 1245  cefTRIAXone (ROCEPHIN) 1 g in dextrose 5 % 50 mL IVPB     1 g100 mL/hr over 30 Minutes Intravenous Every 24 hours 10/05/14 1239        Assessment/Plan: UTI with sepsis-improving History of hidradenitis right axilla. Draining well and should continue to improve with dressing changes and antibiotics. No acute  surgical issues. Will sign off please call if needed    LOS: 3 days    Asjia Berrios T 10/08/2014

## 2014-10-08 NOTE — Discharge Summary (Signed)
Physician Discharge Summary  Michele Mckinney MWN:[id number] DOB: 22-Nov-1990 DOA: 10/05/2014  PCP: No primary care provider on file.  Admit date: 10/05/2014 Discharge date: 10/08/2014  Time spent: 20 minutes  Recommendations for Outpatient Follow-up:  1. Complete doxycycline course and Fluconazole 2. Dressings to axilla 3. CBC + bmet 1 week   Discharge Diagnoses:  Principal Problem:   Sepsis Active Problems:   UTI (lower urinary tract infection)   Nausea and vomiting   Diarrhea   Hypokalemia   Hyponatremia   Discharge Condition: good  Diet recommendation: reg  Filed Weights   10/05/14 0952 10/06/14 0557 10/07/14 2008  Weight: 81.647 kg (180 lb) 86.5 kg (190 lb 11.2 oz) 85.684 kg (188 lb 14.4 oz)    History of present illness:   23 y/o ? admit 10/05/14 with franks severe sepsis-cause possibe Pyelonephritis-Tmax 130, UA=Pyelo, Volume depletion Metabolic acidosis and vomiting. Copious watery diarrhea from 3 days priro. No cp or sob. No cough. Noted that she had a hidradenitis abscess burst in her R axilla day of admission and started oozing. She responded clinically Gen surgery consulted, felt no surgery necessary and suggested Doxycyline Patient afebrile and d/c home with abx, dressing and antifungals WC pending but should cover for Staph and strep.    Discharge Exam: Filed Vitals:   10/08/14 0405  BP: 110/54  Pulse: 72  Temp: 97.7 F (36.5 C)  Resp: 16    General: alert pleasant oriented in nad Cardiovascular: s1 s2 no m/r/g Respiratory: clear  Discharge Instructions You were cared for by a hospitalist during your hospital stay. If you have any questions about your discharge medications or the care you received while you were in the hospital after you are discharged, you can call the unit and asked to speak with the hospitalist on call if the hospitalist that took care of you is not available. Once you are discharged, your primary care physician will handle  any further medical issues. Please note that NO REFILLS for any discharge medications will be authorized once you are discharged, as it is imperative that you return to your primary care physician (or establish a relationship with a primary care physician if you do not have one) for your aftercare needs so that they can reassess your need for medications and monitor your lab values.  Discharge Instructions    Diet - low sodium heart healthy    Complete by:  As directed      Discharge instructions    Complete by:  As directed   You were diagnosed with an Abscess which likely caused your symptoms.  You also might have had a UTI You will need to complete a full course of Antibiotics  You will need to dress the wound in your armpit daily-try not to get that area wet We will also give you 2 more days oral fluconazole for vaginitis Please see your regular Doctor in [place]     Increase activity slowly    Complete by:  As directed           Current Discharge Medication List    START taking these medications   Details  doxycycline (VIBRA-TABS) 100 MG tablet Take 1 tablet (100 mg total) by mouth every 12 (twelve) hours. Qty: 10 tablet, Refills: 0    fluconazole (DIFLUCAN) 100 MG tablet Take 1 tablet (100 mg total) by mouth daily. Qty: 2 tablet, Refills: 0    Gauze Pads & Dressings (VIASORB DRESSING) 4"X6" PADS 1 Package by Does not apply  route daily. Qty: 10 each, Refills: 0       Allergies  Allergen Reactions  . Orange Fruit [Citrus]       The results of significant diagnostics from this hospitalization (including imaging, microbiology, ancillary and laboratory) are listed below for reference.    Significant Diagnostic Studies: Ct Abdomen Pelvis W Contrast  10/05/2014   CLINICAL DATA:  Nausea, vomiting, and diarrhea for 3 days.  EXAM: CT ABDOMEN AND PELVIS WITH CONTRAST  TECHNIQUE: Multidetector CT imaging of the abdomen and pelvis was performed using the standard protocol  following bolus administration of intravenous contrast.  CONTRAST:  100mL OMNIPAQUE IOHEXOL 300 MG/ML  SOLN  COMPARISON:  None.  FINDINGS: The liver, biliary tree, spleen, pancreas, adrenal glands, and kidneys are normal. The bowel is normal including the terminal ileum and appendix. Uterus and ovaries and bladder are normal. No free air or free fluid. No osseous abnormality.  IMPRESSION: Normal exam.   Electronically Signed   By: Geanie CooleyJim  Maxwell M.D.   On: 10/05/2014 16:37    Microbiology: Recent Results (from the past 240 hour(s))  Urine culture     Status: None   Collection Time: 10/05/14 10:57 AM  Result Value Ref Range Status   Specimen Description URINE, CLEAN CATCH  Final   Special Requests NONE  Final   Culture  Setup Time   Final    10/06/2014 00:51 Performed at [facility]    Colony Count   Final    50,000 COLONIES/ML Performed at [facility]    Culture   Final    ESCHERICHIA COLI Performed at [facility]    Report Status 10/07/2014 FINAL  Final   Organism ID, Bacteria ESCHERICHIA COLI  Final      Susceptibility   Escherichia coli - MIC*    AMPICILLIN 4 SENSITIVE Sensitive     CEFAZOLIN <=4 SENSITIVE Sensitive     CEFTRIAXONE <=1 SENSITIVE Sensitive     CIPROFLOXACIN <=0.25 SENSITIVE Sensitive     GENTAMICIN <=1 SENSITIVE Sensitive     LEVOFLOXACIN <=0.12 SENSITIVE Sensitive     NITROFURANTOIN 32 SENSITIVE Sensitive     TOBRAMYCIN <=1 SENSITIVE Sensitive     TRIMETH/SULFA <=20 SENSITIVE Sensitive     PIP/TAZO <=4 SENSITIVE Sensitive     * ESCHERICHIA COLI  Culture, blood (routine x 2)     Status: None (Preliminary result)   Collection Time: 10/05/14  7:50 PM  Result Value Ref Range Status   Specimen Description BLOOD LEFT ARM  Final   Special Requests BOTTLES DRAWN AEROBIC AND ANAEROBIC 10CC  Final   Culture  Setup Time   Final    10/06/2014 00:39 Performed at [facility]    Culture   Final           BLOOD CULTURE RECEIVED NO  GROWTH TO DATE CULTURE WILL BE HELD FOR 5 DAYS BEFORE ISSUING A FINAL NEGATIVE REPORT Performed at [facility]    Report Status PENDING  Incomplete  Culture, blood (routine x 2)     Status: None (Preliminary result)   Collection Time: 10/05/14  8:05 PM  Result Value Ref Range Status   Specimen Description BLOOD LEFT HAND  Final   Special Requests BOTTLES DRAWN AEROBIC ONLY 5CC  Final   Culture  Setup Time   Final    10/06/2014 00:39 Performed at [facility]    Culture   Final           BLOOD  CULTURE RECEIVED NO GROWTH TO DATE CULTURE WILL BE HELD FOR 5 DAYS BEFORE ISSUING A FINAL NEGATIVE REPORT Performed at [facility]    Report Status PENDING  Incomplete  Wound culture     Status: None (Preliminary result)   Collection Time: 10/07/14  3:32 PM  Result Value Ref Range Status   Specimen Description WOUND  Final   Special Requests RIGHT AXILLA  Final   Gram Stain PENDING  Incomplete   Culture   Final    Culture reincubated for better growth Performed at [facility]    Report Status PENDING  Incomplete     Labs: Basic Metabolic Panel:  Recent Labs Lab 10/05/14 0959 10/06/14 0523 10/07/14 0432 10/08/14 0440  NA 125* 135* 138 137  K 2.5* 2.7* 3.5* 4.3  CL 83* 98 104 103  CO2 16* 20 20 21   GLUCOSE 130* 95 110* 82  BUN 11 6 5* 7  CREATININE 0.79 0.70 0.49* 0.53  CALCIUM 8.2* 7.0* 8.2* 8.8  MG  --  1.4*  --   --    Liver Function Tests: No results for input(s): AST, ALT, ALKPHOS, BILITOT, PROT, ALBUMIN in the last 168 hours. No results for input(s): LIPASE, AMYLASE in the last 168 hours. No results for input(s): AMMONIA in the last 168 hours. CBC:  Recent Labs Lab 10/05/14 0959 10/06/14 0523 10/07/14 0432 10/08/14 0440  WBC 26.9* 19.8* 15.2* 13.6*  NEUTROABS 25.6*  --   --  7.2  HGB 11.5* 8.9* 8.9* 9.9*  HCT 32.3* 25.7* 26.2* 29.3*  MCV 79.8 79.3 77.5* 81.2  PLT 269 259 267 315   Cardiac Enzymes: No results for  input(s): CKTOTAL, CKMB, CKMBINDEX, TROPONINI in the last 168 hours. BNP: BNP (last 3 results) No results for input(s): PROBNP in the last 8760 hours. CBG:  Recent Labs Lab 10/05/14 0955  GLUCAP 141*       Signed:  Rhetta Mura  [facility] 10/08/2014, 11:47 AM

## 2014-10-12 LAB — CULTURE, BLOOD (ROUTINE X 2)
CULTURE: NO GROWTH
Culture: NO GROWTH

## 2014-10-12 LAB — WOUND CULTURE

## 2015-04-25 IMAGING — CT CT ABD-PELV W/ CM
2 of 4 series · 16 of 46 positions shown, 18 images · IV contrast (APPLIED)
Comparison: None.

CLINICAL DATA: Nausea, vomiting, and diarrhea for 3 days.

EXAM:
CT ABDOMEN AND PELVIS WITH CONTRAST
TECHNIQUE: Multidetector CT imaging of the abdomen and pelvis was performed
using the standard protocol following bolus administration of
intravenous contrast.
CONTRAST:  100mL OMNIPAQUE IOHEXOL 300 MG/ML  SOLN

[Series 2: abd/ pelvis 5.0 i30f 1 · axial · 0.75mm/px · z∈[+897,+1327]mm · 13 of 94 slices shown, 15 images]
[im 4/94  soft-tissue]
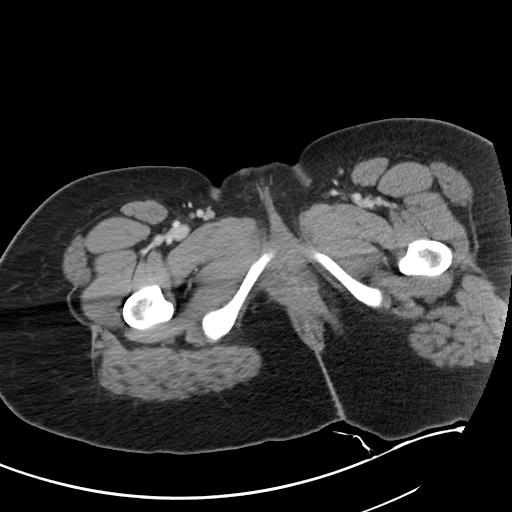
[im 4/94  bone]
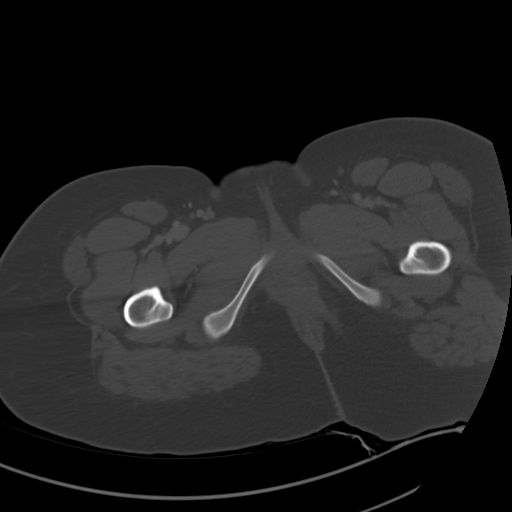
[im 12/94  soft-tissue]
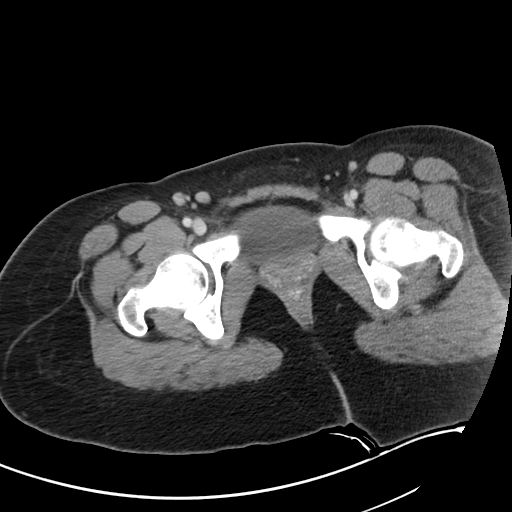
[im 20/94  soft-tissue]
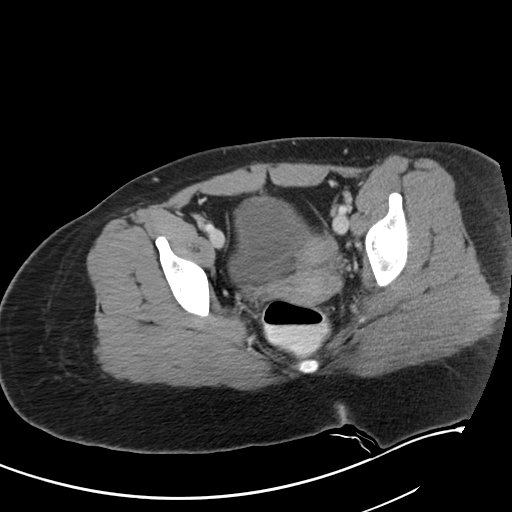
[im 28/94  soft-tissue]
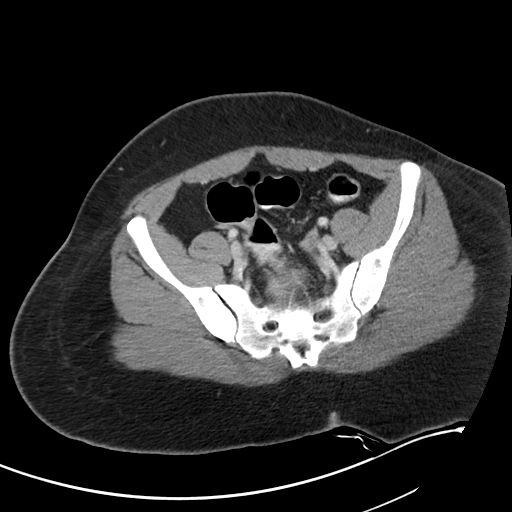
[im 32/94  soft-tissue]
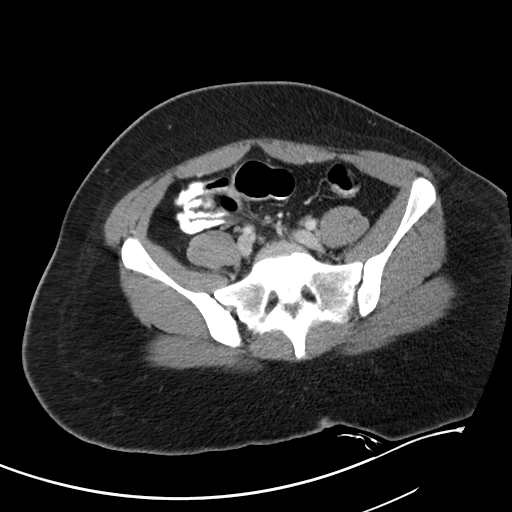
[im 39/94  soft-tissue]
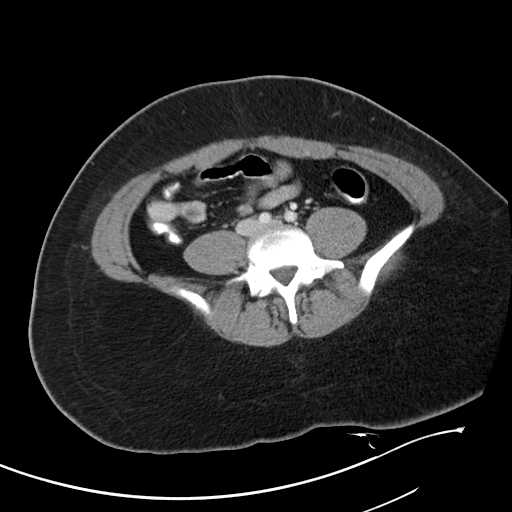
[im 47/94  soft-tissue]
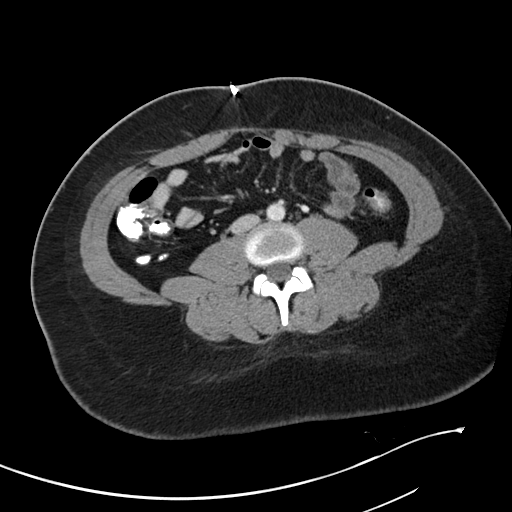
[im 55/94  soft-tissue]
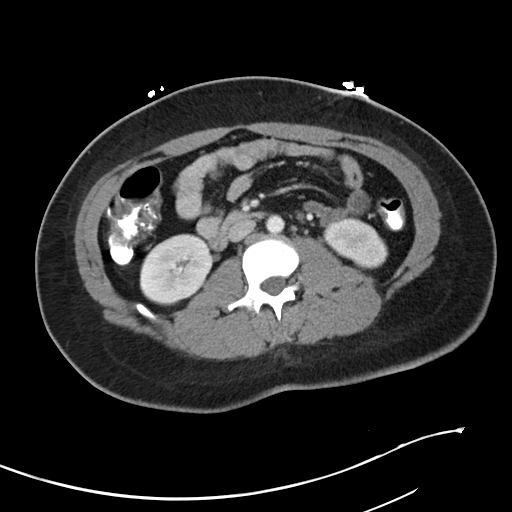
[im 63/94  soft-tissue]
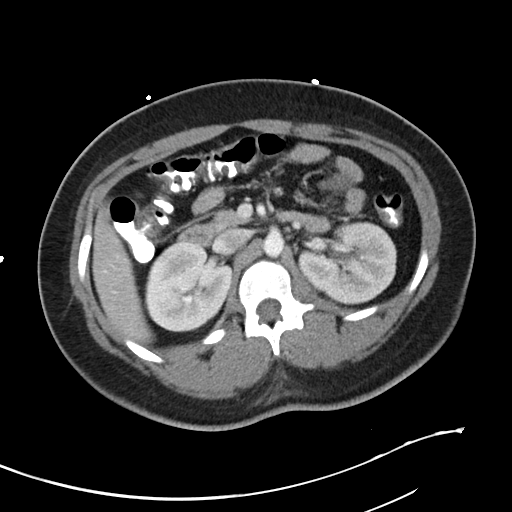
[im 63/94  bone]
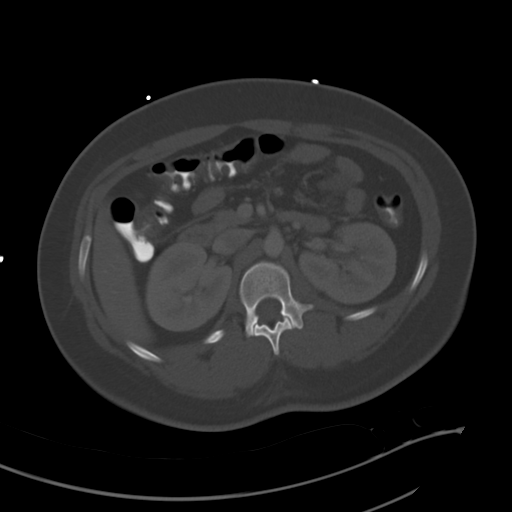
[im 66/94  soft-tissue]
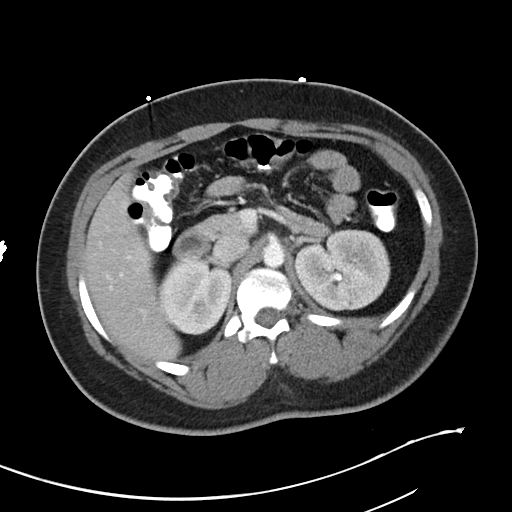
[im 74/94  soft-tissue]
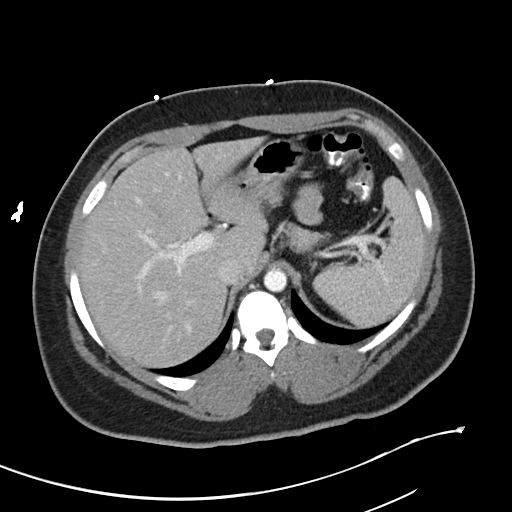
[im 82/94  soft-tissue]
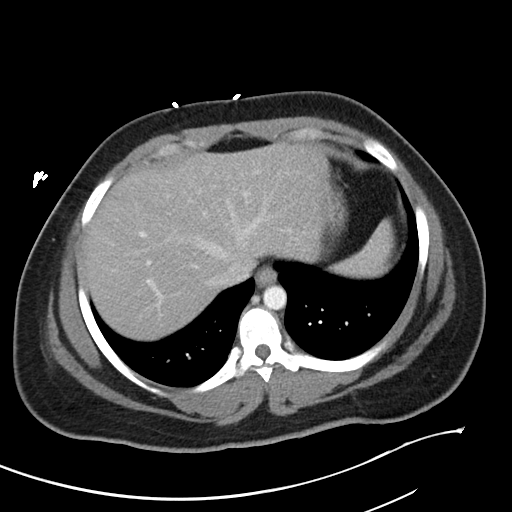
[im 90/94  soft-tissue]
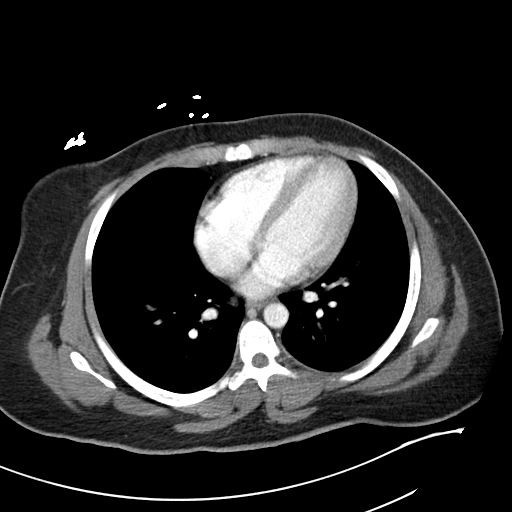

[Series 4: coronal soft tissue · coronal · 0.68mm/px · 3 of 121 slices shown]
[im 41/121  soft-tissue]
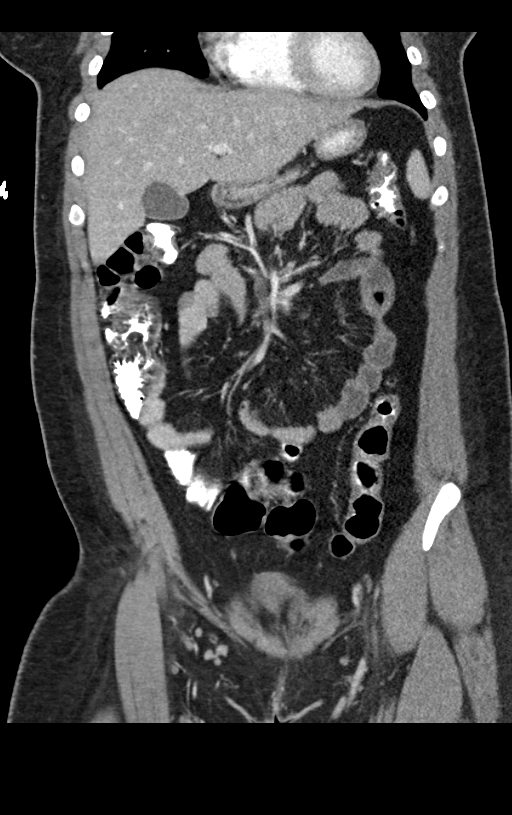
[im 54/121  soft-tissue]
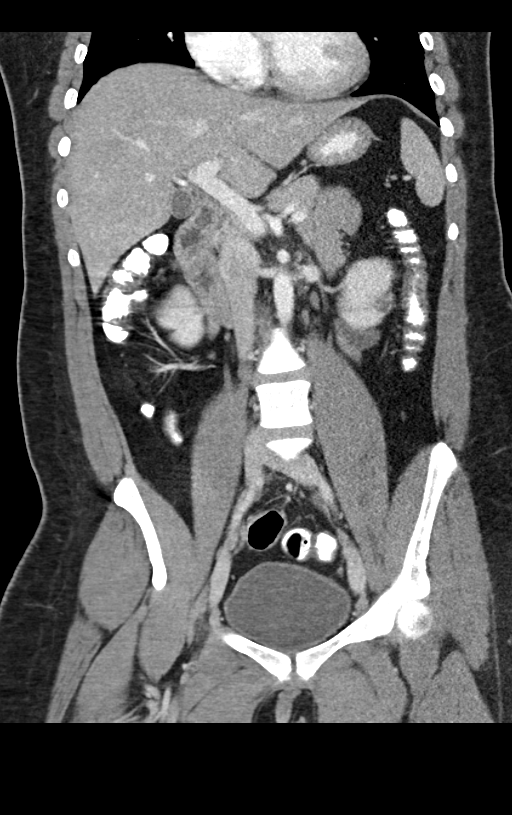
[im 67/121  soft-tissue]
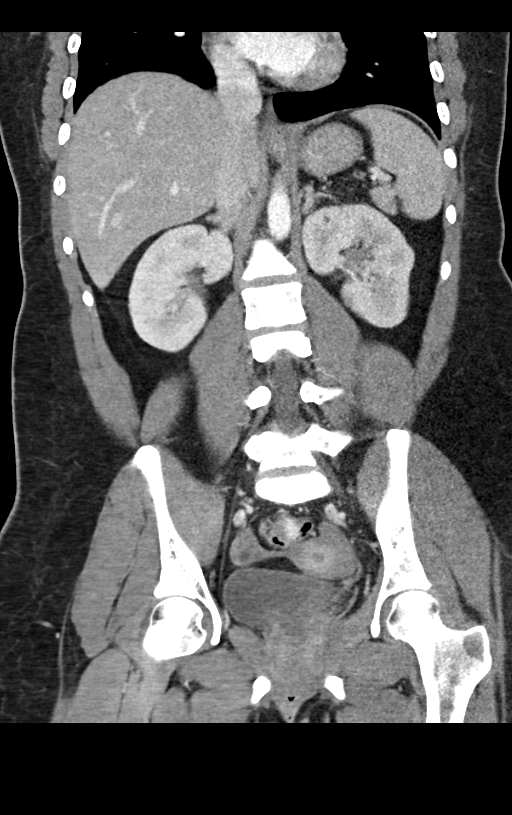

[16 of 46 positions shown; findings below may reference images not displayed]

FINDINGS: The liver, biliary tree, spleen, pancreas, adrenal glands, and
kidneys are normal. The bowel is normal including the terminal ileum
and appendix. Uterus and ovaries and bladder are normal. No free air
or free fluid. No osseous abnormality.
IMPRESSION: Normal exam.
